# Patient Record
Sex: Male | Born: 1940 | Race: White | Hispanic: No | Marital: Single | Smoking: Never smoker
Health system: Southern US, Community
[De-identification: ages and names within clinical notes are randomized; demographics above are authoritative.]

---

## 2016-01-31 ENCOUNTER — Emergency Department (HOSPITAL_COMMUNITY): Payer: Medicare Other

## 2016-01-31 ENCOUNTER — Encounter (HOSPITAL_COMMUNITY): Payer: Self-pay | Admitting: Family Medicine

## 2016-01-31 ENCOUNTER — Emergency Department (HOSPITAL_COMMUNITY)
Admission: EM | Admit: 2016-01-31 | Discharge: 2016-01-31 | Disposition: A | Discharge status: Home / Self Care | Payer: Medicare Other | Attending: Emergency Medicine | Admitting: Emergency Medicine

## 2016-01-31 DIAGNOSIS — R2243 Localized swelling, mass and lump, lower limb, bilateral: Secondary | ICD-10-CM | POA: Diagnosis present

## 2016-01-31 DIAGNOSIS — R600 Localized edema: Secondary | ICD-10-CM | POA: Insufficient documentation

## 2016-01-31 DIAGNOSIS — R609 Edema, unspecified: Secondary | ICD-10-CM

## 2016-01-31 DIAGNOSIS — E8770 Fluid overload, unspecified: Secondary | ICD-10-CM | POA: Insufficient documentation

## 2016-01-31 DIAGNOSIS — E8779 Other fluid overload: Secondary | ICD-10-CM

## 2016-01-31 LAB — CBC
HCT: 43 % (ref 39.0–52.0)
HEMOGLOBIN: 14 g/dL (ref 13.0–17.0)
MCH: 32.6 pg (ref 26.0–34.0)
MCHC: 32.6 g/dL (ref 30.0–36.0)
MCV: 100 fL (ref 78.0–100.0)
PLATELETS: 174 10*3/uL (ref 150–400)
RBC: 4.3 MIL/uL (ref 4.22–5.81)
RDW: 13.1 % (ref 11.5–15.5)
WBC: 6.8 10*3/uL (ref 4.0–10.5)

## 2016-01-31 LAB — I-STAT TROPONIN, ED: TROPONIN I, POC: 0 ng/mL (ref 0.00–0.08)

## 2016-01-31 LAB — BASIC METABOLIC PANEL
ANION GAP: 9 (ref 5–15)
BUN: 13 mg/dL (ref 6–20)
CALCIUM: 8.6 mg/dL — AB (ref 8.9–10.3)
CO2: 35 mmol/L — AB (ref 22–32)
CREATININE: 1.21 mg/dL (ref 0.61–1.24)
Chloride: 96 mmol/L — ABNORMAL LOW (ref 101–111)
GFR calc Af Amer: >60 mL/min (ref >60)
GFR, EST NON AFRICAN AMERICAN: 58 mL/min — AB (ref >60)
GLUCOSE: 124 mg/dL — AB (ref 65–99)
Potassium: 3 mmol/L — ABNORMAL LOW (ref 3.5–5.1)
Sodium: 140 mmol/L (ref 135–145)

## 2016-01-31 LAB — BRAIN NATRIURETIC PEPTIDE: B Natriuretic Peptide: 199.6 pg/mL — ABNORMAL HIGH (ref 0.0–100.0)

## 2016-01-31 MED ORDER — FUROSEMIDE 20 MG PO TABS: 20.0000 mg | ORAL_TABLET | Freq: Every day | ORAL | Status: AC

## 2016-01-31 MED ORDER — POTASSIUM CHLORIDE CRYS ER 20 MEQ PO TBCR: 20.0000 meq | EXTENDED_RELEASE_TABLET | Freq: Two times a day (BID) | ORAL | Status: AC

## 2016-01-31 NOTE — ED Notes (Signed)
MD at bedside. 

## 2016-01-31 NOTE — ED Notes (Addendum)
Pt here with a few weeks of not feeling well. Pt has swelling to BLE, slight SOB at times. Sent here by doctor to r/o kidney failure. Pt has gained 14 pounds in the last 2 weeks. sts eyes are swollen.

## 2016-01-31 NOTE — Discharge Instructions (Signed)
Edema °Edema is an abnormal buildup of fluids in your body tissues. Edema is somewhat dependent on gravity to pull the fluid to the lowest place in your body. That makes the condition more common in the legs and thighs (lower extremities). Painless swelling of the feet and ankles is common and becomes more likely as you get older. It is also common in looser tissues, like around your eyes.  °When the affected area is squeezed, the fluid may move out of that spot and leave a dent for a few moments. This dent is called pitting.  °CAUSES  °There are many possible causes of edema. Eating too much salt and being on your feet or sitting for a long time can cause edema in your legs and ankles. Hot weather may make edema worse. Common medical causes of edema include: °· Heart failure. °· Liver disease. °· Kidney disease. °· Weak blood vessels in your legs. °· Cancer. °· An injury. °· Pregnancy. °· Some medications. °· Obesity.  °SYMPTOMS  °Edema is usually painless. Your skin may look swollen or shiny.  °DIAGNOSIS  °Your health care provider may be able to diagnose edema by asking about your medical history and doing a physical exam. You may need to have tests such as X-rays, an electrocardiogram, or blood tests to check for medical conditions that may cause edema.  °TREATMENT  °Edema treatment depends on the cause. If you have heart, liver, or kidney disease, you need the treatment appropriate for these conditions. General treatment may include: °· Elevation of the affected body part above the level of your heart. °· Compression of the affected body part. Pressure from elastic bandages or support stockings squeezes the tissues and forces fluid back into the blood vessels. This keeps fluid from entering the tissues. °· Restriction of fluid and salt intake. °· Use of a water pill (diuretic). These medications are appropriate only for some types of edema. They pull fluid out of your body and make you urinate more often. This  gets rid of fluid and reduces swelling, but diuretics can have side effects. Only use diuretics as directed by your health care provider. °HOME CARE INSTRUCTIONS  °· Keep the affected body part above the level of your heart when you are lying down.   °· Do not sit still or stand for prolonged periods.   °· Do not put anything directly under your knees when lying down. °· Do not wear constricting clothing or garters on your upper legs.   °· Exercise your legs to work the fluid back into your blood vessels. This may help the swelling go down.   °· Wear elastic bandages or support stockings to reduce ankle swelling as directed by your health care provider.   °· Eat a low-salt diet to reduce fluid if your health care provider recommends it.   °· Only take medicines as directed by your health care provider.  °SEEK MEDICAL CARE IF:  °· Your edema is not responding to treatment. °· You have heart, liver, or kidney disease and notice symptoms of edema. °· You have edema in your legs that does not improve after elevating them.   °· You have sudden and unexplained weight gain. °SEEK IMMEDIATE MEDICAL CARE IF:  °· You develop shortness of breath or chest pain.   °· You cannot breathe when you lie down. °· You develop pain, redness, or warmth in the swollen areas.   °· You have heart, liver, or kidney disease and suddenly get edema. °· You have a fever and your symptoms suddenly get worse. °MAKE SURE YOU:  °·   Understand these instructions. °· Will watch your condition. °· Will get help right away if you are not doing well or get worse. °  °This information is not intended to replace advice given to you by your health care provider. Make sure you discuss any questions you have with your health care provider. °  °Document Released: 10/19/2005 Document Revised: 11/09/2014 Document Reviewed: 08/11/2013 °[employer]vier Interactive Patient Education ©2016 [employer] ° °

## 2016-01-31 NOTE — ED Provider Notes (Addendum)
CSN: [id number]     Arrival date & time 01/31/16  1416 History   First MD Initiated Contact with Patient 01/31/16 1615     Chief Complaint  Patient presents with  . Leg Swelling     (Consider location/radiation/quality/duration/timing/severity/associated sxs/prior Treatment) HPI Comments: Patient is a 75 year old male with a history of hypertension currently on HCTZ who is traveling here from out of town presents today with 17 day history of worsening leg swelling and today began with mild shortness of breath upon exertion. Patient is extremely active and states he has been sleeping a lot more and having a hard time sleeping at night. He states approximately 2 weeks ago she had URI type symptoms but did not require antibiotics and resolved on their own however he has noticed the swelling in his legs have continued to worsen. He denies any fever at any time, chest pain or palpitations. He has been taking his HCTZ daily without improvement in his symptoms. He has no prior history of MI or cardiac disease other than pericarditis several years ago. He called his cardiologist today who recommended he come to the emergency room and ensure that he was not in renal failure or having other acute issues.  The history is provided by the patient.    History reviewed. No pertinent past medical history. History reviewed. No pertinent past surgical history. History reviewed. No pertinent family history. Social History  Substance Use Topics  . Smoking status: Never Smoker   . Smokeless tobacco: None  . Alcohol Use: No    Review of Systems  Respiratory:       Long-standing lung issues after being exposed to mold but does not use inhalers regularly  All other systems reviewed and are negative.     Allergies  Sulfa antibiotics  Home Medications   Prior to Admission medications   Not on File   BP 159/76 mmHg  Pulse 89  Temp(Src) 97.7 F (36.5 C) (Oral)  Resp 20  Wt 184 lb 8 oz (83.689 kg)   SpO2 95% Physical Exam  Constitutional: He is oriented to person, place, and time. He appears well-developed and well-nourished. No distress.  HENT:  Head: Normocephalic and atraumatic.  Mouth/Throat: Oropharynx is clear and moist.  Eyes: Conjunctivae and EOM are normal. Pupils are equal, round, and reactive to light.  Neck: Normal range of motion. Neck supple.  Cardiovascular: Normal rate, regular rhythm and intact distal pulses.   No murmur heard. Pulmonary/Chest: Effort normal. No respiratory distress. He has no wheezes. He has rales in the right lower field and the left lower field.  Abdominal: Soft. He exhibits distension. There is no tenderness. There is no rebound and no guarding.  Musculoskeletal: Normal range of motion. He exhibits edema. He exhibits no tenderness.  2+ pitting edema up to the knee bilaterally  Neurological: He is alert and oriented to person, place, and time.  Skin: Skin is warm and dry. No rash noted. No erythema.  Psychiatric: He has a normal mood and affect. His behavior is normal.  Nursing note and vitals reviewed.   ED Course  Procedures (including critical care time) Labs Review Labs Reviewed  BASIC METABOLIC PANEL - Abnormal; Notable for the following:    Potassium 3.0 (*)    Chloride 96 (*)    CO2 35 (*)    Glucose, Bld 124 (*)    Calcium 8.6 (*)    GFR calc non Af Amer 58 (*)    All other components within  normal limits  BRAIN NATRIURETIC PEPTIDE - Abnormal; Notable for the following:    B Natriuretic Peptide 199.6 (*)    All other components within normal limits  CBC  I-STAT TROPOININ, ED    Imaging Review Dg Chest 2 View  01/31/2016  CLINICAL DATA:  Two week history of shortness of breath EXAM: CHEST  2 VIEW COMPARISON:  None. FINDINGS: There are small pleural effusions bilaterally with mild bibasilar atelectasis. There is no frank edema or consolidation. Heart size and pulmonary vascularity are normal. No adenopathy. There is a bifid rib  on each side, an anatomic variant. IMPRESSION: No edema or consolidation. Small pleural effusions with bibasilar atelectasis. Cardiac silhouette within normal limits. Electronically Signed   By: Bretta BangWilliam  Woodruff III M.D.   On: 01/31/2016 15:53   I have personally reviewed and evaluated these images and lab results as part of my medical decision-making.   EKG Interpretation   Date/Time:  Friday January 31 2016 14:39:02 EDT Ventricular Rate:  74 PR Interval:  164 QRS Duration: 92 QT Interval:  468 QTC Calculation: 519 R Axis:   92 Text Interpretation:  Normal sinus rhythm Possible Left atrial enlargement  Rightward axis Incomplete right bundle branch block T wave abnormality,  consider inferior ischemia Prolonged QT Confirmed by Anitra LauthPLUNKETT  MD, Alphonzo LemmingsWHITNEY  [phone]([id number]) on 01/31/2016 4:17:59 PM      MDM   Final diagnoses:  Peripheral edema  Other hypervolemia    Patient is a 75 year old male with evidence of fluid overload today. He has gained a proximally 14 pounds in the last 17 days with worsening swelling that is not improving with HCTZ. He has no prior history of CHF but does have a history of pericarditis several years ago. He currently is only taking HCTZ. He recently had URI symptoms approximately 3 weeks ago which have completely resolved. He denies any fever, chest pain or palpitations. Low suspicion the patient's symptoms are related to an infectious cause. He does not have symptoms consistent with endocarditis. Chest x-ray is not consistent with pericarditis and he is not having any chest pain. Renal function is within normal limits and hemoglobin is normal on lab evaluation. Troponin is negative and EKG does show an incomplete right bundle branch block with some T-wave abnormality but no prior EKGs available to evaluate. However given patient has denied any chest pain and has no prior history of cardiac disease low suspicion for MI at this time. He denies any recent surgeries or IV fluids  which would've caused the fluid overload. He does travel a lot but denies any unilateral leg pain or symptoms concerning for PE. Slightly hypertensive here but has not taken his HCTZ today as instructed by his cardiologist. BNP pending.  5:29 PM BNP elevated at 199.  Treated with Lasix and patient has follow-up with his cardiologist when he gets back into town next week. Patient was cautioned if he develops any chest pain to return to the hospital immediately.    Gwyneth SproutWhitney Jhostin Epps, MD 01/31/16 1730  Gwyneth SproutWhitney Nazaret Chea, MD 01/31/16 1734

## 2017-03-14 IMAGING — CR DG CHEST 2V
2 series · 2 of 2 positions shown · non-contrast
Comparison: None.

CLINICAL DATA: Two week history of shortness of breath

EXAM:
CHEST  2 VIEW

[chest pa]
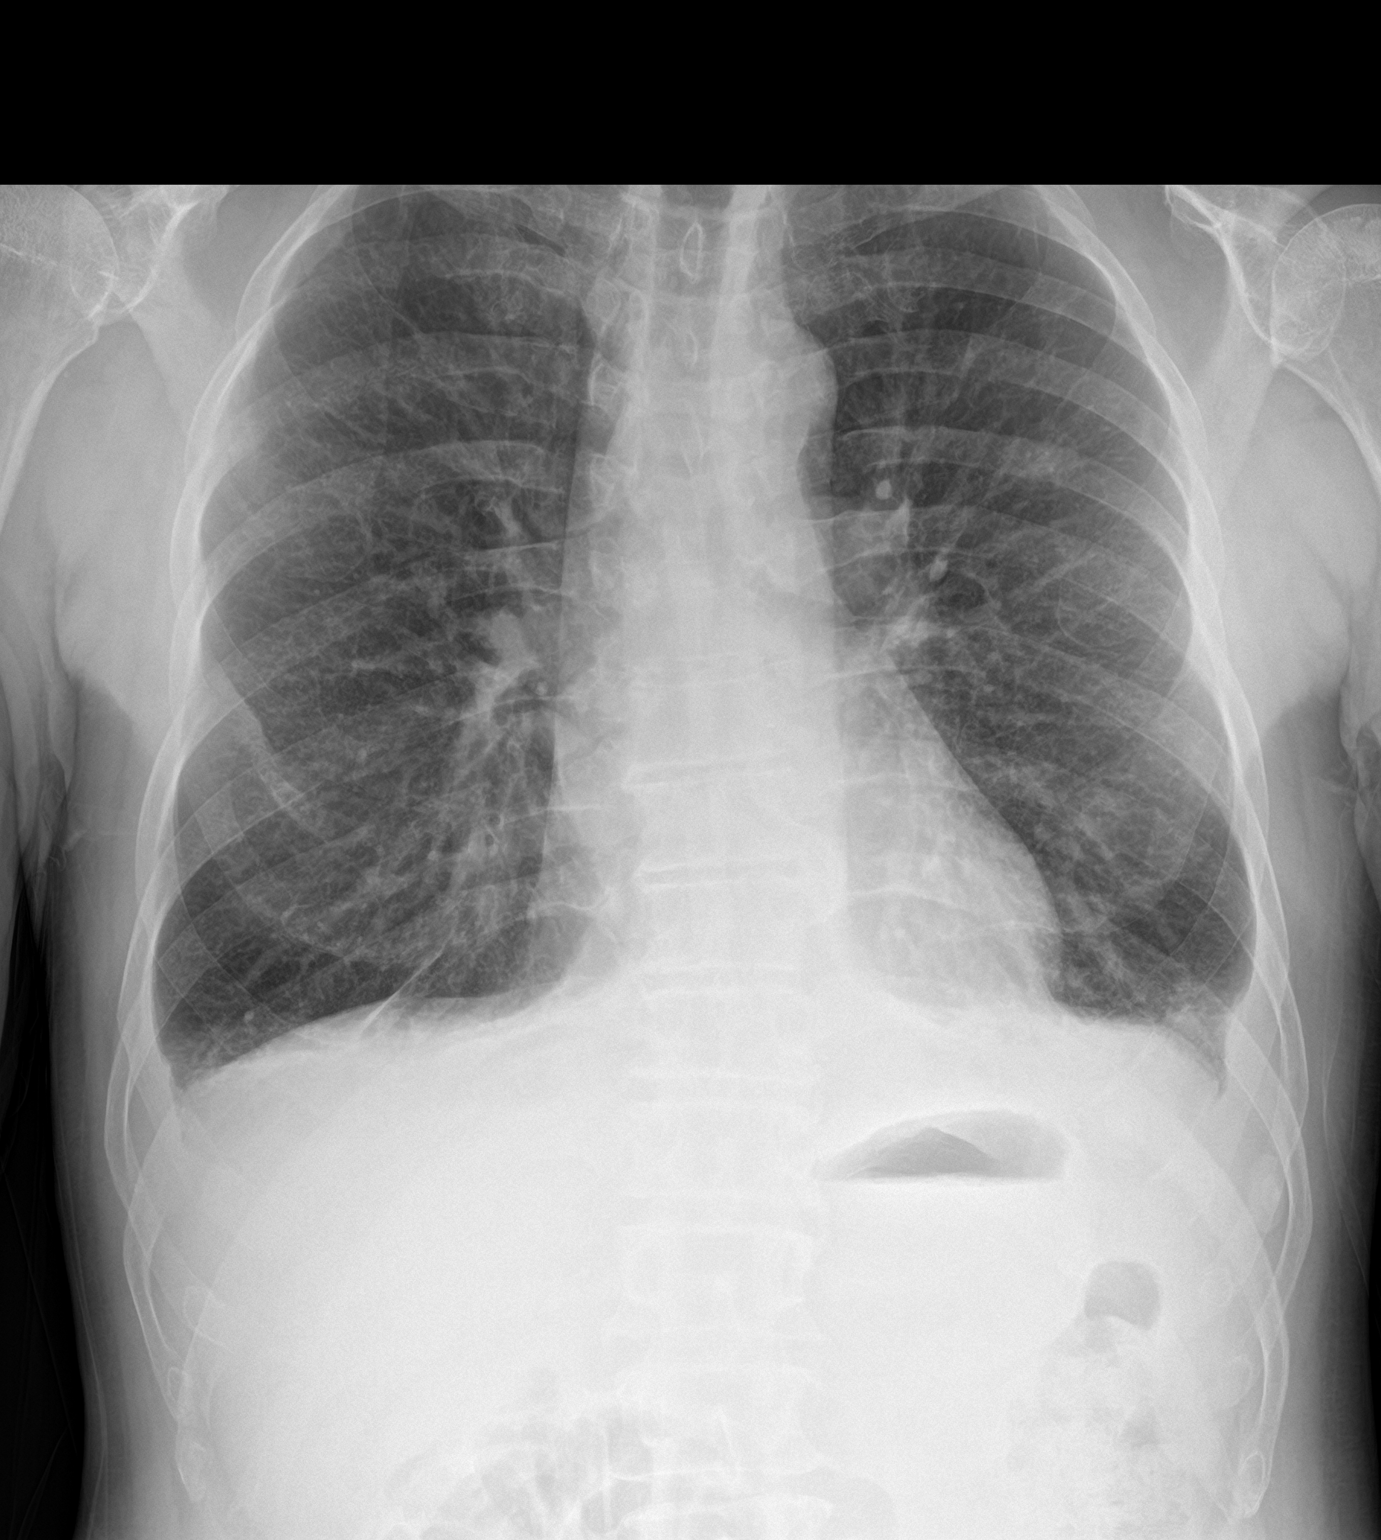

[chest lat]
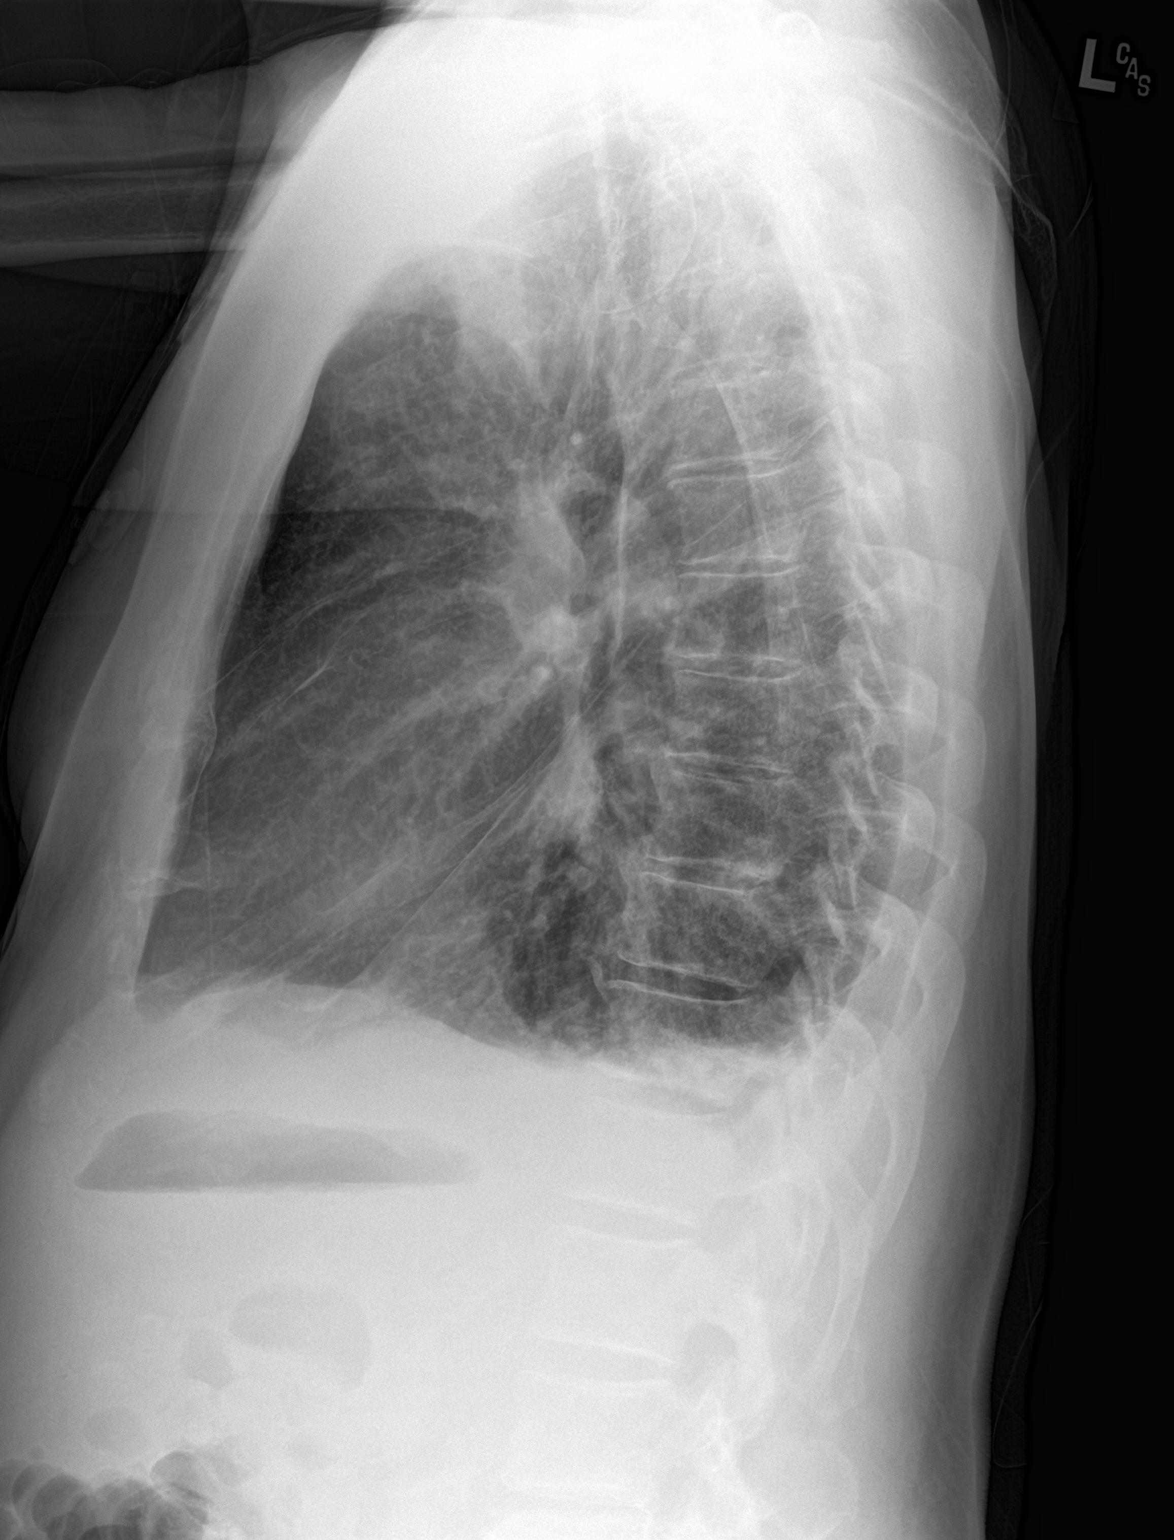

[2 of 2 positions shown; findings below may reference images not displayed]

FINDINGS: There are small pleural effusions bilaterally with mild bibasilar
atelectasis. There is no frank edema or consolidation. Heart size
and pulmonary vascularity are normal. No adenopathy. There is a
bifid rib on each side, an anatomic variant.
IMPRESSION: No edema or consolidation. Small pleural effusions with bibasilar
atelectasis. Cardiac silhouette within normal limits.
